# Patient Record
Sex: Male | Born: 1970 | Race: White | Hispanic: No | Marital: Married | Smoking: Never smoker
Health system: Southern US, Community
[De-identification: ages and names within clinical notes are randomized; demographics above are authoritative.]

## PROBLEM LIST (undated history)

## (undated) DIAGNOSIS — Z789 Other specified health status: Secondary | ICD-10-CM

## (undated) DIAGNOSIS — K6289 Other specified diseases of anus and rectum: Secondary | ICD-10-CM

## (undated) HISTORY — PX: LYMPH NODE BIOPSY: SHX201

## (undated) HISTORY — PX: COLONOSCOPY: SHX174

## (undated) HISTORY — PX: TYMPANOSTOMY TUBE PLACEMENT: SHX32

## (undated) HISTORY — PX: WISDOM TOOTH EXTRACTION: SHX21

## (undated) HISTORY — PX: TONSILLECTOMY: SUR1361

---

## 2000-11-04 ENCOUNTER — Encounter: Admission: RE | Admit: 2000-11-04 | Discharge: 2000-11-04 | Payer: Self-pay | Admitting: *Deleted

## 2011-05-21 ENCOUNTER — Other Ambulatory Visit: Payer: Self-pay | Admitting: Family Medicine

## 2011-05-21 ENCOUNTER — Ambulatory Visit
Admission: RE | Admit: 2011-05-21 | Discharge: 2011-05-21 | Disposition: A | Discharge status: Home / Self Care | Payer: Managed Care, Other (non HMO) | Source: Ambulatory Visit | Attending: Family Medicine | Admitting: Family Medicine

## 2011-05-21 DIAGNOSIS — R51 Headache: Secondary | ICD-10-CM

## 2011-05-23 ENCOUNTER — Other Ambulatory Visit: Payer: Self-pay

## 2011-05-29 ENCOUNTER — Other Ambulatory Visit: Payer: Self-pay | Admitting: Family Medicine

## 2011-05-29 DIAGNOSIS — R51 Headache: Secondary | ICD-10-CM

## 2011-06-01 ENCOUNTER — Ambulatory Visit
Admission: RE | Admit: 2011-06-01 | Discharge: 2011-06-01 | Disposition: A | Discharge status: Home / Self Care | Payer: Managed Care, Other (non HMO) | Source: Ambulatory Visit | Attending: Family Medicine | Admitting: Family Medicine

## 2011-06-01 DIAGNOSIS — R51 Headache: Secondary | ICD-10-CM

## 2011-06-01 MED ORDER — GADOBENATE DIMEGLUMINE 529 MG/ML IV SOLN
17.0000 mL | Freq: Once | INTRAVENOUS | Status: AC | PRN
  Administered 2011-06-01: 17 mL via INTRAVENOUS

## 2011-06-04 ENCOUNTER — Other Ambulatory Visit: Payer: Managed Care, Other (non HMO)

## 2012-03-23 ENCOUNTER — Other Ambulatory Visit: Payer: Self-pay | Admitting: Family Medicine

## 2012-03-23 DIAGNOSIS — R109 Unspecified abdominal pain: Secondary | ICD-10-CM

## 2012-03-24 ENCOUNTER — Ambulatory Visit
Admission: RE | Admit: 2012-03-24 | Discharge: 2012-03-24 | Disposition: A | Discharge status: Home / Self Care | Payer: Managed Care, Other (non HMO) | Source: Ambulatory Visit | Attending: Family Medicine | Admitting: Family Medicine

## 2012-03-24 DIAGNOSIS — R109 Unspecified abdominal pain: Secondary | ICD-10-CM

## 2012-04-20 ENCOUNTER — Other Ambulatory Visit: Payer: Self-pay | Admitting: Gastroenterology

## 2012-04-20 DIAGNOSIS — R109 Unspecified abdominal pain: Secondary | ICD-10-CM

## 2012-04-26 ENCOUNTER — Ambulatory Visit
Admission: RE | Admit: 2012-04-26 | Discharge: 2012-04-26 | Disposition: A | Discharge status: Home / Self Care | Payer: Self-pay | Source: Ambulatory Visit | Attending: Gastroenterology | Admitting: Gastroenterology

## 2012-04-26 ENCOUNTER — Other Ambulatory Visit: Payer: Self-pay | Admitting: Gastroenterology

## 2012-04-26 DIAGNOSIS — R109 Unspecified abdominal pain: Secondary | ICD-10-CM

## 2013-05-02 ENCOUNTER — Other Ambulatory Visit: Payer: Self-pay | Admitting: Otolaryngology

## 2013-05-02 ENCOUNTER — Ambulatory Visit
Admission: RE | Admit: 2013-05-02 | Discharge: 2013-05-02 | Disposition: A | Discharge status: Home / Self Care | Payer: Managed Care, Other (non HMO) | Source: Ambulatory Visit | Attending: Otolaryngology | Admitting: Otolaryngology

## 2013-05-02 DIAGNOSIS — R042 Hemoptysis: Secondary | ICD-10-CM

## 2013-05-03 ENCOUNTER — Other Ambulatory Visit: Payer: Self-pay | Admitting: Otolaryngology

## 2013-05-03 ENCOUNTER — Ambulatory Visit
Admission: RE | Admit: 2013-05-03 | Discharge: 2013-05-03 | Disposition: A | Discharge status: Home / Self Care | Payer: Managed Care, Other (non HMO) | Source: Ambulatory Visit | Attending: Otolaryngology | Admitting: Otolaryngology

## 2013-05-03 DIAGNOSIS — R042 Hemoptysis: Secondary | ICD-10-CM

## 2013-05-03 MED ORDER — IOHEXOL 300 MG/ML  SOLN
75.0000 mL | Freq: Once | INTRAMUSCULAR | Status: AC | PRN
  Administered 2013-05-03: 75 mL via INTRAVENOUS

## 2014-05-22 ENCOUNTER — Encounter (HOSPITAL_BASED_OUTPATIENT_CLINIC_OR_DEPARTMENT_OTHER): Payer: Self-pay | Admitting: *Deleted

## 2014-05-22 NOTE — Progress Notes (Signed)
No labs per anesthesia needed-called [facility] for orders

## 2014-05-23 ENCOUNTER — Other Ambulatory Visit: Payer: Self-pay | Admitting: General Surgery

## 2014-05-24 ENCOUNTER — Ambulatory Visit (HOSPITAL_BASED_OUTPATIENT_CLINIC_OR_DEPARTMENT_OTHER): Payer: Managed Care, Other (non HMO) | Admitting: Anesthesiology

## 2014-05-24 ENCOUNTER — Encounter (HOSPITAL_BASED_OUTPATIENT_CLINIC_OR_DEPARTMENT_OTHER)
Admission: RE | Disposition: A | Discharge status: Home / Self Care | Payer: Self-pay | Source: Ambulatory Visit | Attending: General Surgery

## 2014-05-24 ENCOUNTER — Ambulatory Visit (HOSPITAL_BASED_OUTPATIENT_CLINIC_OR_DEPARTMENT_OTHER)
Admission: RE | Admit: 2014-05-24 | Discharge: 2014-05-24 | Disposition: A | Discharge status: Home / Self Care | Payer: Managed Care, Other (non HMO) | Source: Ambulatory Visit | Attending: General Surgery | Admitting: General Surgery

## 2014-05-24 ENCOUNTER — Encounter (HOSPITAL_BASED_OUTPATIENT_CLINIC_OR_DEPARTMENT_OTHER): Payer: Self-pay

## 2014-05-24 DIAGNOSIS — K610 Anal abscess: Secondary | ICD-10-CM | POA: Diagnosis present

## 2014-05-24 HISTORY — DX: Other specified health status: Z78.9

## 2014-05-24 HISTORY — DX: Other specified diseases of anus and rectum: K62.89

## 2014-05-24 HISTORY — PX: MASS EXCISION: SHX2000

## 2014-05-24 HISTORY — PX: RECTAL EXAM UNDER ANESTHESIA: SHX6399

## 2014-05-24 LAB — POCT HEMOGLOBIN-HEMACUE: Hemoglobin: 15.6 g/dL (ref 13.0–17.0)

## 2014-05-24 SURGERY — EXAM UNDER ANESTHESIA, RECTUM
Anesthesia: General | Site: Rectum

## 2014-05-24 MED ORDER — CEFAZOLIN SODIUM-DEXTROSE 2-3 GM-% IV SOLR
2.0000 g | INTRAVENOUS | Status: AC
  Administered 2014-05-24: 2 g via INTRAVENOUS

## 2014-05-24 MED ORDER — CEFAZOLIN SODIUM-DEXTROSE 2-3 GM-% IV SOLR
INTRAVENOUS | Status: AC
  Filled 2014-05-24: qty 50

## 2014-05-24 MED ORDER — BUPIVACAINE-EPINEPHRINE (PF) 0.25% -1:200000 IJ SOLN
INTRAMUSCULAR | Status: AC
  Filled 2014-05-24: qty 30

## 2014-05-24 MED ORDER — DIBUCAINE 1 % RE OINT
TOPICAL_OINTMENT | RECTAL | Status: AC
  Filled 2014-05-24: qty 28

## 2014-05-24 MED ORDER — MIDAZOLAM HCL 2 MG/2ML IJ SOLN
INTRAMUSCULAR | Status: AC
  Filled 2014-05-24: qty 2

## 2014-05-24 MED ORDER — PROMETHAZINE HCL 25 MG/ML IJ SOLN: 6.2500 mg | INTRAMUSCULAR | Status: DC | PRN

## 2014-05-24 MED ORDER — LACTATED RINGERS IV SOLN
INTRAVENOUS | Status: DC
Start: 2014-05-24 — End: 2014-05-24
  Administered 2014-05-24 (×2): via INTRAVENOUS

## 2014-05-24 MED ORDER — BUPIVACAINE LIPOSOME 1.3 % IJ SUSP
INTRAMUSCULAR | Status: DC | PRN
  Administered 2014-05-24: 20 mL

## 2014-05-24 MED ORDER — OXYCODONE HCL 5 MG PO TABS: 5.0000 mg | ORAL_TABLET | Freq: Once | ORAL | Status: DC | PRN

## 2014-05-24 MED ORDER — HYDROMORPHONE HCL 1 MG/ML IJ SOLN: 0.2500 mg | INTRAMUSCULAR | Status: DC | PRN

## 2014-05-24 MED ORDER — FENTANYL CITRATE (PF) 100 MCG/2ML IJ SOLN
INTRAMUSCULAR | Status: AC
  Filled 2014-05-24: qty 6

## 2014-05-24 MED ORDER — BUPIVACAINE LIPOSOME 1.3 % IJ SUSP
INTRAMUSCULAR | Status: AC
  Filled 2014-05-24: qty 20

## 2014-05-24 MED ORDER — LIDOCAINE HCL (CARDIAC) 20 MG/ML IV SOLN
INTRAVENOUS | Status: DC | PRN
  Administered 2014-05-24: 100 mg via INTRAVENOUS

## 2014-05-24 MED ORDER — FENTANYL CITRATE (PF) 100 MCG/2ML IJ SOLN
INTRAMUSCULAR | Status: DC | PRN
  Administered 2014-05-24: 100 ug via INTRAVENOUS
  Administered 2014-05-24 (×2): 25 ug via INTRAVENOUS

## 2014-05-24 MED ORDER — PROPOFOL 10 MG/ML IV BOLUS
INTRAVENOUS | Status: DC | PRN
  Administered 2014-05-24: 200 mg via INTRAVENOUS

## 2014-05-24 MED ORDER — DIBUCAINE 1 % RE OINT
TOPICAL_OINTMENT | RECTAL | Status: DC | PRN
  Administered 2014-05-24: 1 via RECTAL

## 2014-05-24 MED ORDER — DEXAMETHASONE SODIUM PHOSPHATE 4 MG/ML IJ SOLN
INTRAMUSCULAR | Status: DC | PRN
  Administered 2014-05-24: 10 mg via INTRAVENOUS

## 2014-05-24 MED ORDER — MIDAZOLAM HCL 2 MG/2ML IJ SOLN
1.0000 mg | INTRAMUSCULAR | Status: DC | PRN
  Administered 2014-05-24: 2 mg via INTRAVENOUS

## 2014-05-24 MED ORDER — OXYCODONE-ACETAMINOPHEN 5-325 MG PO TABS: 1.0000 | ORAL_TABLET | ORAL | Status: AC | PRN

## 2014-05-24 MED ORDER — GLYCOPYRROLATE 0.2 MG/ML IJ SOLN
INTRAMUSCULAR | Status: DC | PRN
  Administered 2014-05-24: 0.2 mg via INTRAVENOUS

## 2014-05-24 MED ORDER — OXYCODONE HCL 5 MG/5ML PO SOLN: 5.0000 mg | Freq: Once | ORAL | Status: DC | PRN

## 2014-05-24 SURGICAL SUPPLY — 55 items
BLADE HEX COATED 2.75 (ELECTRODE) ×3 IMPLANT
BLADE SURG 10 STRL SS (BLADE) ×3 IMPLANT
BLADE SURG 15 STRL LF DISP TIS (BLADE) ×1 IMPLANT
BLADE SURG 15 STRL SS (BLADE) ×2
BRIEF STRETCH FOR OB PAD LRG (UNDERPADS AND DIAPERS) ×3 IMPLANT
CANISTER SUCT 1200ML W/VALVE (MISCELLANEOUS) IMPLANT
CHLORAPREP W/TINT 26ML (MISCELLANEOUS) IMPLANT
COVER BACK TABLE 60X90IN (DRAPES) IMPLANT
COVER MAYO STAND STRL (DRAPES) ×3 IMPLANT
DECANTER SPIKE VIAL GLASS SM (MISCELLANEOUS) IMPLANT
DRAPE LAPAROTOMY 100X72 PEDS (DRAPES) ×3 IMPLANT
DRAPE UTILITY XL STRL (DRAPES) ×3 IMPLANT
DRSG PAD ABDOMINAL 8X10 ST (GAUZE/BANDAGES/DRESSINGS) ×3 IMPLANT
ELECT REM PT RETURN 9FT ADLT (ELECTROSURGICAL) ×3
ELECTRODE REM PT RTRN 9FT ADLT (ELECTROSURGICAL) ×1 IMPLANT
GAUZE SPONGE 4X4 12PLY STRL (GAUZE/BANDAGES/DRESSINGS) ×3 IMPLANT
GLOVE BIO SURGEON STRL SZ 6 (GLOVE) ×3 IMPLANT
GLOVE BIO SURGEON STRL SZ 6.5 (GLOVE) ×2 IMPLANT
GLOVE BIO SURGEONS STRL SZ 6.5 (GLOVE) ×1
GLOVE BIOGEL PI IND STRL 6.5 (GLOVE) ×1 IMPLANT
GLOVE BIOGEL PI IND STRL 7.0 (GLOVE) ×1 IMPLANT
GLOVE BIOGEL PI INDICATOR 6.5 (GLOVE) ×2
GLOVE BIOGEL PI INDICATOR 7.0 (GLOVE) ×2
GLOVE ECLIPSE 6.5 STRL STRAW (GLOVE) ×3 IMPLANT
GOWN STRL REUS W/ TWL LRG LVL3 (GOWN DISPOSABLE) ×1 IMPLANT
GOWN STRL REUS W/TWL 2XL LVL3 (GOWN DISPOSABLE) ×3 IMPLANT
GOWN STRL REUS W/TWL LRG LVL3 (GOWN DISPOSABLE) ×2
LIQUID BAND (GAUZE/BANDAGES/DRESSINGS) ×3 IMPLANT
NEEDLE HYPO 25X1 1.5 SAFETY (NEEDLE) ×3 IMPLANT
NS IRRIG 1000ML POUR BTL (IV SOLUTION) IMPLANT
PACK BASIN DAY SURGERY FS (CUSTOM PROCEDURE TRAY) ×3 IMPLANT
PACK UNIVERSAL I (CUSTOM PROCEDURE TRAY) ×3 IMPLANT
PENCIL BUTTON HOLSTER BLD 10FT (ELECTRODE) ×3 IMPLANT
SLEEVE SCD COMPRESS KNEE MED (MISCELLANEOUS) ×3 IMPLANT
SPONGE GAUZE 4X4 12PLY STER LF (GAUZE/BANDAGES/DRESSINGS) ×3 IMPLANT
SPONGE HEMORRHOID 8X3CM (HEMOSTASIS) ×3 IMPLANT
SPONGE LAP 18X18 X RAY DECT (DISPOSABLE) ×3 IMPLANT
STAPLER VISISTAT 35W (STAPLE) IMPLANT
SUT CHROMIC 3 0 SH 27 (SUTURE) ×3 IMPLANT
SUT CHROMIC 4 0 PS 2 18 (SUTURE) ×6 IMPLANT
SUT CHROMIC 4 0 RB 1X27 (SUTURE) ×3 IMPLANT
SUT MNCRL AB 4-0 PS2 18 (SUTURE) ×3 IMPLANT
SUT SILK 3 0 TIES 17X18 (SUTURE)
SUT SILK 3-0 18XBRD TIE BLK (SUTURE) IMPLANT
SUT VIC AB 2-0 SH 27 (SUTURE)
SUT VIC AB 2-0 SH 27XBRD (SUTURE) IMPLANT
SUT VIC AB 3-0 SH 27 (SUTURE) ×2
SUT VIC AB 3-0 SH 27X BRD (SUTURE) ×1 IMPLANT
SUT VICRYL 4-0 PS2 18IN ABS (SUTURE) ×3 IMPLANT
SYR CONTROL 10ML LL (SYRINGE) ×3 IMPLANT
TOWEL OR 17X24 6PK STRL BLUE (TOWEL DISPOSABLE) ×3 IMPLANT
TOWEL OR NON WOVEN STRL DISP B (DISPOSABLE) ×3 IMPLANT
TUBE CONNECTING 20'X1/4 (TUBING)
TUBE CONNECTING 20X1/4 (TUBING) IMPLANT
YANKAUER SUCT BULB TIP NO VENT (SUCTIONS) IMPLANT

## 2014-05-24 NOTE — Anesthesia Procedure Notes (Signed)
Procedure Name: LMA Insertion Date/Time: 05/24/2014 2:39 PM Performed by: Gar GibbonKEETON, Jalene Demo S Pre-anesthesia Checklist: Patient identified, Emergency Drugs available, Suction available and Patient being monitored Patient Re-evaluated:Patient Re-evaluated prior to inductionOxygen Delivery Method: Circle System Utilized Preoxygenation: Pre-oxygenation with 100% oxygen Intubation Type: IV induction Ventilation: Mask ventilation without difficulty LMA: LMA inserted LMA Size: 5.0 Number of attempts: 1 Airway Equipment and Method: Bite block Placement Confirmation: positive ETCO2 Tube secured with: Tape Dental Injury: Teeth and Oropharynx as per pre-operative assessment

## 2014-05-24 NOTE — Transfer of Care (Signed)
Immediate Anesthesia Transfer of Care Note  Patient: Carl Bolton  Procedure(s) Performed: Procedure(s): RECTAL EXAM UNDER ANESTHESIA (N/A) EXCISION OF PERIANAL  MASS (N/A)  Patient Location: PACU  Anesthesia Type:General  Level of Consciousness: awake, sedated and patient cooperative  Airway & Oxygen Therapy: Patient Spontanous Breathing and Patient connected to face mask oxygen  Post-op Assessment: Report given to RN and Post -op Vital signs reviewed and stable  Post vital signs: Reviewed and stable  Last Vitals:  Filed Vitals:   05/24/14 1531  BP:   Pulse: 83  Temp:   Resp:     Complications: No apparent anesthesia complications

## 2014-05-24 NOTE — Op Note (Addendum)
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    SURGEON:  Surgeon(s): Almond LintFaera Symeon Puleo, MD  Assistant:   Engineer, structuralAriel Hilsinger, PA-S  ANESTHESIA:   local and general  DRAINS: none   LOCAL MEDICATIONS USED:  OTHER exparel  SPECIMEN:  Source of Specimen:  posterior perianal mass  DISPOSITION OF SPECIMEN:  PATHOLOGY  COUNTS:  YES  DICTATION: .Dragon Dictation  PLAN OF CARE: Discharge to home after PACU  PATIENT DISPOSITION:  PACU - hemodynamically stable.  FINDINGS:  8 mm firm chronic abscess cavity with fistula track external to the anus  EBL: min  PROCEDURE:   Patient was finally taken operating room where he was placed supine on the operating room table. General anesthesia was induced. The patient was then placed into low lithotomy position. A timeout was performed according to the surgical safety checklist. When all was correct, we continued.    The anus was prepped and draped in standard fashion. A the anus was examined and the small firm knot was identified just posterior to the anal verge in the midline. A small incision was made over the mass. The scissors were used to dissect around this. The area of firmness continued in a stalk to the superficial anal mucosa. The defect was completely excised and closed with a running 4-0 chromic. A 3-0 chromic was used on the deeper layers.    The area was dressed with gelfoam, dibucaine ointment, ABD pads, and mesh underwear.  Counts were correct.

## 2014-05-24 NOTE — Discharge Instructions (Signed)
[facility] _______[facility] ° °RECTAL SURGERY POST OP INSTRUCTIONS: POST OP INSTRUCTIONS ° °Always review your discharge instruction sheet given to you by the facility where your surgery was performed. °IF YOU HAVE DISABILITY OR FAMILY LEAVE FORMS, YOU MUST BRING THEM TO THE OFFICE FOR PROCESSING.   °DO NOT GIVE THEM TO YOUR DOCTOR. ° °1. A  prescription for pain medication may be given to you upon discharge.  Take your pain medication as prescribed, if needed.  If narcotic pain medicine is not needed, then you may take acetaminophen (Tylenol) or ibuprofen (Advil) as needed. °2. Take your usually prescribed medications unless otherwise directed. °3. If you need a refill on your pain medication, please contact your pharmacy.  They will contact our office to request authorization. Prescriptions will not be filled after 5 pm or on week-ends. °4. You should follow a light diet the first 48 hours after arrival home, such as soup and crackers, etc.  Be sure to include lots of fluids daily.  Resume your normal diet 2-3 days after surgery.. °5. Most patients will experience some swelling and discomfort in the rectal area. Ice packs, reclining and warm tub soaks will help.  Swelling and discomfort can take several days to resolve.  °6. It is common to experience some constipation if taking pain medication after surgery.  Increasing fluid intake and taking a stool softener (such as Colace) will usually help or prevent this problem from occurring.  A mild laxative (Milk of Magnesia or Miralax) should be taken according to package directions if there are no bowel movements after 48 hours. °7. Unless discharge instructions indicate otherwise, leave your bandage dry and in place for 24 hours, or remove the bandage if you have a bowel movement. You may notice a small amount of bleeding with bowel movements for the first few days. You may have some packing in the rectum which will come out over the first day or two. You  will need to wear an absorbent pad or soft cotton gauze in your underwear until the drainage stops.it. °8. ACTIVITIES:  You may resume regular (light) daily activities beginning the next day--such as daily self-care, walking, climbing stairs--gradually increasing activities as tolerated.  You may have sexual intercourse when it is comfortable.  Refrain from any heavy lifting or straining until approved by your doctor. °a. You may drive when you are no longer taking prescription pain medication, you can comfortably wear a seatbelt, and you can safely maneuver your car and apply brakes. °b. RETURN TO WORK: : ____________________ °c.  °9. You should see your doctor in the office for a follow-up appointment approximately 2-3 weeks after your surgery.  Make sure that you call for this appointment within a day or two after you arrive home to insure a convenient appointment time. °10. OTHER INSTRUCTIONS:  __________________________________________________________________________________________________________________________________________________________________________________________  °WHEN TO CALL YOUR DOCTOR: °1. Fever over 101.0 °2. Inability to urinate °3. Nausea and/or vomiting °4. Extreme swelling or bruising °5. Continued bleeding from rectum. °6. Increased pain, redness, or drainage from the incision °7. Constipation ° °The clinic staff is available to answer your questions during regular business hours.  Please don’t hesitate to call and ask to speak to one of the nurses for clinical concerns.  If you have a medical emergency, go to the nearest emergency room or call 911.  A surgeon from [facility] is always on call at the hospital ° ° °[address], [address], [place], [place]  [address] ? °   [id number], [place], [place]   [id number] [phone]) [phone] ? [phone] ? FAX [phone]) [phone] Web site: [email or web address]   Post Anesthesia Home Care Instructions  Activity: Get  plenty of rest for the remainder of the day. A responsible adult should stay with you for 24 hours following the procedure.  For the next 24 hours, DO NOT: -Drive a car -Advertising copywriterperate machinery -Drink alcoholic beverages -Take any medication unless instructed by your physician -Make any legal decisions or sign important papers.  Meals: Start with liquid foods such as gelatin or soup. Progress to regular foods as tolerated. Avoid greasy, spicy, heavy foods. If nausea and/or vomiting occur, drink only clear liquids until the nausea and/or vomiting subsides. Call your physician if vomiting continues.  Special Instructions/Symptoms: Your throat may feel dry or sore from the anesthesia or the breathing tube placed in your throat during surgery. If this causes discomfort, gargle with warm salt water. The discomfort should disappear within 24 hours.  If you had a scopolamine patch placed behind your ear for the management of post- operative nausea and/or vomiting:  1. The medication in the patch is effective for 72 hours, after which it should be removed.  Wrap patch in a tissue and discard in the trash. Wash hands thoroughly with soap and water. 2. You may remove the patch earlier than 72 hours if you experience unpleasant side effects which may include dry mouth, dizziness or visual disturbances. 3. Avoid touching the patch. Wash your hands with soap and water after contact with the patch.     Information for Discharge Teaching: EXPAREL (bupivacaine liposome injectable suspension)   Your surgeon gave you EXPAREL(bupivacaine) in your surgical incision to help control your pain after surgery.   EXPAREL is a local anesthetic that provides pain relief by numbing the tissue around the surgical site.  EXPAREL is designed to release pain medication over time and can control pain for up to 72 hours.  Depending on how you respond to EXPAREL, you may require less pain medication during your  recovery.  Possible side effects:  Temporary loss of sensation or ability to move in the area where bupivacaine was injected.  Nausea, vomiting, constipation  Rarely, numbness and tingling in your mouth or lips, lightheadedness, or anxiety may occur.  Call your doctor right away if you think you may be experiencing any of these sensations, or if you have other questions regarding possible side effects.  Follow all other discharge instructions given to you by your surgeon or nurse. Eat a healthy diet and drink plenty of water or other fluids.  If you return to the hospital for any reason within 96 hours following the administration of EXPAREL, please inform your health care providers.

## 2014-05-24 NOTE — H&P (Signed)
  Carl BouchardJason S. Haith 05/15/2014 10:42 AM Location: [facility] Patient #: [id number] DOB: 08/16/1970 Married / Language: [place] / Race: White Male  History of Present Illness  Patient words: rectal abscess.  The patient is a 44 year old male who presents with anal lesions. Previous history [Pt was seen around 2 weeks ago for perianal pain and was thought to have a thrombosed hemorrhoid. He ended up having a small posterior perirectal abscess which I performed an I&D on. Since then, the pain has resolved, but the knot he was feeling there has gotten slightly larger. He denies drainage.] He continues to have the small knot in the perianal skin. He will periodically notice it swell and drain. However, it continues to NOT go away, and repeatedly causes problems.    Allergies Fay Records(Ashley Beck, CMA; 05/15/2014 10:42 AM) No Known Drug Allergies12/02/2013  Medication History Fay Records(Ashley Beck, CMA; 05/15/2014 10:42 AM) Amoxicillin-Pot Clavulanate (875-125MG  Tablet, Oral) Active. No Current Medications (Taken starting 05/15/2014) GaviLyte-N with Flavor Pack (420GM For Solution, Oral) Active. ProAir RespiClick (108 (90 Base)MCG/ACT Aero Pow Br Act, Inhalation) Active. Proctosol HC (2.5% Cream, Rectal) Active. Qnasl (80MCG/ACT Aerosol Soln, Nasal) Active. Medications Reconciled  Review of Systems Almond Lint(Parks Czajkowski MD; 05/23/2014 4:51 PM) All other systems negative   Vitals Fay Records(Ashley Beck CMA; 05/15/2014 10:43 AM) 05/15/2014 10:43 AM Weight: 195 lb Height: 73in Body Surface Area: 2.13 m Body Mass Index: 25.73 kg/m Temp.: 98.59F(Oral)  Pulse: 72 (Regular)  Resp.: 18 (Unlabored)  BP: 128/78 (Sitting, Left Arm, Standard)    Physical Exam Almond Lint(Ahman Dugdale MD; 05/23/2014 4:52 PM) General Mental Status-Alert. General Appearance-Consistent with stated age. Hydration-Well hydrated. Voice-Normal.  Head and Neck Head-normocephalic, atraumatic with no lesions or palpable  masses. Trachea-midline. Thyroid Gland Characteristics - normal size and consistency.  Cardiovascular Cardiovascular examination reveals -normal pedal pulses bilaterally. Note: regular rate and rhythm  Rectal Note: posteriorly, there is still a 8-10 mm knot in just distal to the dentate line. There is still a small sinus tract and tenderness in this region.     Assessment & Plan Almond Lint(Othelia Riederer MD; 05/23/2014 4:55 PM) PERIANAL MASS (787.99  R19.8) Impression: Given the prolonged nature of this mass, I will take him to the OR and excise it under general anaesthesia. If it is a chronic abscess, we may just have to excise it. Current Plans  Schedule for Surgery Instructions:  Main risks are bleeding, infection, pain, recurrent problem if infectious, difficulty healing.   Signed by Almond LintFaera Bertram Haddix, MD (05/23/2014 4:57 PM)

## 2014-05-24 NOTE — Anesthesia Postprocedure Evaluation (Signed)
Anesthesia Post Note  Patient: Carl BouchardJason S Seabrooks  Procedure(s) Performed: Procedure(s) (LRB): RECTAL EXAM UNDER ANESTHESIA (N/A) EXCISION OF PERIANAL  MASS (N/A)  Anesthesia type: general  Patient location: PACU  Post pain: Pain level controlled  Post assessment: Patient's Cardiovascular Status Stable  Last Vitals:  Filed Vitals:   05/24/14 1600  BP: 126/62  Pulse: 51  Temp: 36.4 C  Resp: 18    Post vital signs: Reviewed and stable  Level of consciousness: sedated  Complications: No apparent anesthesia complications

## 2014-05-24 NOTE — Anesthesia Preprocedure Evaluation (Signed)
Anesthesia Evaluation  Patient identified by MRN, date of birth, ID band Patient awake    Reviewed: Allergy & Precautions, H&P , NPO status , Patient's Chart, lab work & pertinent test results  Airway Mallampati: II TM Distance: >3 FB Neck ROM: full    Dental  (+) Teeth Intact, Dental Advidsory Given   Pulmonary neg pulmonary ROS,  breath sounds clear to auscultation        Cardiovascular negative cardio ROS  Rhythm:regular Rate:Normal     Neuro/Psych negative neurological ROS  negative psych ROS   GI/Hepatic negative GI ROS, Neg liver ROS,   Endo/Other  negative endocrine ROS  Renal/GU negative Renal ROS     Musculoskeletal   Abdominal   Peds  Hematology   Anesthesia Other Findings   Reproductive/Obstetrics negative OB ROS                           Anesthesia Physical Anesthesia Plan  ASA: I  Anesthesia Plan: General LMA   Post-op Pain Management:    Induction:   Airway Management Planned:   Additional Equipment:   Intra-op Plan:   Post-operative Plan:   Informed Consent: I have reviewed the patients History and Physical, chart, labs and discussed the procedure including the risks, benefits and alternatives for the proposed anesthesia with the patient or authorized representative who has indicated his/her understanding and acceptance.   Dental Advisory Given  Plan Discussed with: Anesthesiologist, CRNA and Surgeon  Anesthesia Plan Comments:         Anesthesia Quick Evaluation  

## 2014-05-25 ENCOUNTER — Encounter (HOSPITAL_BASED_OUTPATIENT_CLINIC_OR_DEPARTMENT_OTHER): Payer: Self-pay | Admitting: General Surgery

## 2014-05-28 NOTE — Progress Notes (Signed)
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 ______

## 2014-09-12 ENCOUNTER — Other Ambulatory Visit: Payer: Self-pay | Admitting: *Deleted

## 2014-09-12 DIAGNOSIS — G5603 Carpal tunnel syndrome, bilateral upper limbs: Secondary | ICD-10-CM

## 2014-09-17 ENCOUNTER — Telehealth: Payer: Self-pay | Admitting: Neurology

## 2014-09-17 NOTE — Telephone Encounter (Signed)
I imagine he does not, but unsure.  Morrie Sheldon, please advise.

## 2014-09-17 NOTE — Telephone Encounter (Signed)
Pt has a question/ Should he be getting a consult before the 10/02/14 EMG appt first? Call back @ [phone]

## 2014-09-19 NOTE — Telephone Encounter (Signed)
Patient given appt 

## 2014-09-19 NOTE — Telephone Encounter (Signed)
He is having numbness in his ear.  It started in his hand but has eased up there.  He says that it is driving him crazy.  He will be gone all next week and really wants to be seen.  If we have a cancellation, he can be here within 30 minutes.

## 2014-09-19 NOTE — Telephone Encounter (Signed)
His referring provider can decide whether he needs a consult or not.  We can place him on a wait list, if he does.  Kelon Easom K. Allena Katz, DO

## 2014-09-25 ENCOUNTER — Encounter: Payer: Managed Care, Other (non HMO) | Admitting: Neurology

## 2014-10-02 ENCOUNTER — Ambulatory Visit (INDEPENDENT_AMBULATORY_CARE_PROVIDER_SITE_OTHER): Payer: Managed Care, Other (non HMO) | Admitting: Neurology

## 2014-10-02 DIAGNOSIS — G5602 Carpal tunnel syndrome, left upper limb: Secondary | ICD-10-CM

## 2014-10-02 DIAGNOSIS — G5601 Carpal tunnel syndrome, right upper limb: Secondary | ICD-10-CM | POA: Diagnosis not present

## 2014-10-02 DIAGNOSIS — G5603 Carpal tunnel syndrome, bilateral upper limbs: Secondary | ICD-10-CM

## 2014-10-02 NOTE — Procedures (Signed)
[facility]  [address] [place], [address]  [place], [place] [address] Tel: [phone] Fax:  [phone] Test Date:  10/02/2014  Patient: Carl Bolton DOB: 01/24/1971 Physician: Nita Sickle, DO  Sex: Male Height: 6\' 1"  Ref Phys: Elwyn Lade, M.D.  ID#: [id number] Temp: 32.3C Technician: Judie Petit. Dean   Patient Complaints: This is a 44 year old gentleman presenting for evaluation of bilateral hand numbness and tingling.   NCV & EMG Findings: Extensive electrodiagnostic testing of the right upper extremity and additional studies of the left shows:  1. Bilateral median, ulnar, radial, and palmar sensory responses are within normal limits. 2. Bilateral median and ulnar motor responses are within normal limits. 3. There is no evidence of active or chronic motor axon loss changes affecting any of the tested muscles. Motor unit recruitment and configuration is within normal limits.  Impression: This is a normal study. In particular, there is no evidence of a cervical radiculopathy or carpal tunnel syndrome affecting the upper extremities.   ___________________________ Nita Sickle, DO    Nerve Conduction Studies Anti Sensory Summary Table   Site NR Peak (ms) Norm Peak (ms) P-T Amp (V) Norm P-T Amp  Left Median Anti Sensory (2nd Digit)  32.3C  Wrist    3.3 <3.4 55.2 >20  Right Median Anti Sensory (2nd Digit)  32.3C  Wrist    3.0 <3.4 42.5 >20  Left Radial Anti Sensory (Base 1st Digit)  32.3C  Wrist    2.4 <2.7 29.2 >18  Right Radial Anti Sensory (Base 1st Digit)  32.3C  Wrist    2.1 <2.7 31.5 >18  Left Ulnar Anti Sensory (5th Digit)  32.3C  Wrist    2.9 <3.1 29.4 >12  Right Ulnar Anti Sensory (5th Digit)  32.3C  Wrist    3.1 <3.1 26.0 >12   Motor Summary Table   Site NR Onset (ms) Norm Onset (ms) O-P Amp (mV) Norm O-P Amp Site1 Site2 Delta-0 (ms) Dist (cm) Vel (m/s) Norm Vel (m/s)  Left Median Motor (Abd Poll Brev)  32.3C  Wrist    3.3 <3.9 10.1 >6 Elbow Wrist  4.7 25.0 53 >50  Elbow    8.0  9.9         Right Median Motor (Abd Poll Brev)  32.3C  Wrist    2.8 <3.9 8.7 >6 Elbow Wrist 4.7 27.0 57 >50  Elbow    7.5  8.2         Left Ulnar Motor (Abd Dig Minimi)  32.3C  Wrist    2.8 <3.1 8.8 >7 B Elbow Wrist 4.3 25.0 58 >50  B Elbow    7.1  8.6  A Elbow B Elbow 1.7 10.0 59 >50  A Elbow    8.8  8.0         Right Ulnar Motor (Abd Dig Minimi)  32.3C  Wrist    2.6 <3.1 8.1 >7 B Elbow Wrist 4.1 22.0 54 >50  B Elbow    6.7  7.9  A Elbow B Elbow 1.6 10.0 62 >50  A Elbow    8.3  7.6          Comparison Summary Table   Site NR Peak (ms) Norm Peak (ms) P-T Amp (V) Site1 Site2 Delta-P (ms) Norm Delta (ms)  Left Median/Ulnar Palm Comparison (Wrist - 8cm)  32.3C  Median Palm    1.7 <2.2 127.2 Median Palm Ulnar Palm 0.1   Ulnar Palm    1.8 <2.2 70.1  Right Median/Ulnar Palm Comparison (Wrist - 8cm)  32.3C  Median Palm    1.7 <2.2 36.6 Median Palm Ulnar Palm 0.1   Ulnar Palm    1.8 <2.2 30.7       F Wave Studies   NR F-Lat (ms) Lat Norm (ms) L-R F-Lat (ms)  Left Ulnar (Mrkrs) (Abd Dig Min)  32.3C     29.49 <33 0.76  Right Ulnar (Mrkrs) (Abd Dig Min)  32.3C     28.73 <33 0.76   EMG   Side Muscle Ins Act Fibs Psw Fasc Number Recrt Dur Dur. Amp Amp. Poly Poly. Comment  Left 1stDorInt Nml Nml Nml Nml Nml Nml Nml Nml Nml Nml Nml Nml N/A  Left Ext Indicis Nml Nml Nml Nml Nml Nml Nml Nml Nml Nml Nml Nml N/A  Left PronatorTeres Nml Nml Nml Nml Nml Nml Nml Nml Nml Nml Nml Nml N/A  Left Biceps Nml Nml Nml Nml Nml Nml Nml Nml Nml Nml Nml Nml N/A  Left Triceps Nml Nml Nml Nml Nml Nml Nml Nml Nml Nml Nml Nml N/A  Left Deltoid Nml Nml Nml Nml Nml Nml Nml Nml Nml Nml Nml Nml N/A  Right 1stDorInt Nml Nml Nml Nml Nml Nml Nml Nml Nml Nml Nml Nml N/A  Right Ext Indicis Nml Nml Nml Nml Nml Nml Nml Nml Nml Nml Nml Nml N/A  Right PronatorTeres Nml Nml Nml Nml Nml Nml Nml Nml Nml Nml Nml Nml N/A  Right Biceps Nml Nml Nml Nml Nml Nml Nml Nml Nml Nml Nml Nml N/A   Right Triceps Nml Nml Nml Nml Nml Nml Nml Nml Nml Nml Nml Nml N/A  Right Deltoid Nml Nml Nml Nml Nml Nml Nml Nml Nml Nml Nml Nml N/A    Waveforms:

## 2014-10-10 ENCOUNTER — Encounter: Payer: Self-pay | Admitting: *Deleted

## 2014-10-11 ENCOUNTER — Ambulatory Visit (INDEPENDENT_AMBULATORY_CARE_PROVIDER_SITE_OTHER): Payer: Managed Care, Other (non HMO) | Admitting: Neurology

## 2014-10-11 ENCOUNTER — Encounter: Payer: Self-pay | Admitting: Neurology

## 2014-10-11 VITALS — BP 126/82 | HR 72 | Temp 98.1°F | Resp 16 | Ht 73.0 in | Wt 187.9 lb

## 2014-10-11 DIAGNOSIS — R5383 Other fatigue: Secondary | ICD-10-CM

## 2014-10-11 DIAGNOSIS — G44221 Chronic tension-type headache, intractable: Secondary | ICD-10-CM

## 2014-10-11 DIAGNOSIS — R202 Paresthesia of skin: Secondary | ICD-10-CM | POA: Insufficient documentation

## 2014-10-11 DIAGNOSIS — R5381 Other malaise: Secondary | ICD-10-CM | POA: Diagnosis not present

## 2014-10-11 DIAGNOSIS — R252 Cramp and spasm: Secondary | ICD-10-CM

## 2014-10-11 DIAGNOSIS — R209 Unspecified disturbances of skin sensation: Secondary | ICD-10-CM | POA: Diagnosis not present

## 2014-10-11 LAB — C-REACTIVE PROTEIN: CRP: <0.5 mg/dL (ref <0.60)

## 2014-10-11 LAB — VITAMIN B12: VITAMIN B 12: 1812 pg/mL — AB (ref 211–911)

## 2014-10-11 NOTE — Progress Notes (Signed)
Note routed

## 2014-10-11 NOTE — Progress Notes (Signed)
[facility] Clinic Note - Initial Visit   Date: 10/11/2014  Carl Bolton MRN: [id number] DOB: 03/20/1970   Dear Dr. Moreen Fowler:  Thank you for your kind referral of Carl Bolton for consultation of generalized paresthesias. Although his history is well known to you, please allow [place] to reiterate it for the purpose of our medical record. The patient was accompanied to the clinic by wife who also provides collateral information.     History of Present Illness: Carl Bolton is a 44 y.o. right-handed Caucasian male with asthma and anxiety presenting for evaluation of a constellation of neurological symptoms.    In early July, he woke up with right sided jaw pain worsening with clenching over the TMJ region and saw a dentist whose x-rays were normal.  He then saw endodontist whose evaluation was normal. They suspected there was possible muscle strain.  A week after symptom onset, he developed tingling sensation of the outer ear canal.  He was evaluated by Dr. Redmond Baseman at [facility] for right ear numbness. He was treated with antibiotic ear drops.  Since then, he has developed tingling sensation of the fingers and hands which is sporadic.  He tried ibuprofen and wrist splints which has helped.  It is worsened with strenuous activity.  He underwent EMG of the upper extremities performed by myself which was normal and did not show evidence of CTS.   On August 12th, he woke up feeling as if his hands and head was pulsing.  He was taking lorazepam daily for anxiety which was started on August 3rd.  On August 26th, he started experiencing muscle twitches of the calf muscles bilaterally.   He has chronic fatigue and dizziness in the morning.  He has constant pulsing sensation of the entire body which is worse at rest.  He feels sensation of motion even when he has stopped and at rest.    He occasionally has right sided throbbing headache since 2013.  Headaches are episodic and  occur about 3-5 times per month.  No associated photophobia, phonophobia, n/v.    His wife states that he is hypervigilant on all health-related issues and has greater anxiety related to his symptoms, moreso than generalized anxiety.  He spends a lot of time online researching his symptoms which makes him even more concerned and worried.  Out-side paper records, electronic medical record, and images have been reviewed where available and summarized as:  EMG of the upper extremities 10/02/2014:  This is a normal study. In particular, there is no evidence of a cervical radiculopathy or carpal tunnel syndrome affecting the upper extremities.  Labs 09/07/2014:  Na 141, K 5.0, glucose 127, Cr 0.85, Ca 10.0, AST 18, ALT 17, TSH 0.79  MRI brain 06/01/2011:   1. No acute intracranial abnormality. 2.  Mild for age nonspecific subcortical white matter signal changes.  Differential considerations include accelerated small vessel ischemia, sequelae of trauma, hypercoagulable state, vasculitis, migraines, prior infection or demyelination.  MRA head 05/21/2011: Negative MR angiography of the intracranial circulation.  No stenosis, dissection, or aneurysm is identified.  Past Medical History  Diagnosis Date  . Medical history non-contributory   . Rectal mass     drains occ    Past Surgical History  Procedure Laterality Date  . Tonsillectomy    . Wisdom tooth extraction    . Colonoscopy    . Tympanostomy tube placement      both ears x2  . Lymph node biopsy  neck as teen  . Rectal exam under anesthesia N/A 05/24/2014    Procedure: RECTAL EXAM UNDER ANESTHESIA;  Surgeon: Stark Klein, MD;  Location: [place];  Service: General;  Laterality: N/A;  . Mass excision N/A 05/24/2014    Procedure: EXCISION OF PERIANAL  MASS;  Surgeon: Stark Klein, MD;  Location: [place];  Service: General;  Laterality: N/A;     Medications:  Outpatient Encounter Prescriptions as of  10/11/2014  Medication Sig Note  . magnesium oxide (MAG-OX) 400 MG tablet Take 400 mg by mouth daily.   . Multiple Vitamins-Minerals (MULTIVITAMIN WITH MINERALS) tablet Take 1 tablet by mouth daily.   Carl Bolton 80 MCG/ACT AERS  10/10/2014: Received from: External Pharmacy  . vitamin B-12 (CYANOCOBALAMIN) 100 MCG tablet Take 100 mcg by mouth daily.   . Albuterol Sulfate (PROAIR RESPICLICK) 026 (90 BASE) MCG/ACT AEPB INHALE 2 PUFFS EVERY 4-6 HOURS AS NEEDED. 10/10/2014: Received from: [facility]  . EPINEPHrine (EPIPEN 2-PAK) 0.3 mg/0.3 mL IJ SOAJ injection Inject into the muscle once.   Marland Kitchen LORazepam (ATIVAN) 0.5 MG tablet Take 0.5 mg by mouth every 8 (eight) hours.   Marland Kitchen oxyCODONE-acetaminophen (ROXICET) 5-325 MG per tablet Take 1-2 tablets by mouth every 4 (four) hours as needed for severe pain. (Patient not taking: Reported on 10/11/2014)    No facility-administered encounter medications on file as of 10/11/2014.     Allergies: No Known Allergies  Family History: Family History  Problem Relation Age of Onset  . Thyroid disease Father   . Hypothyroidism Sister     Social History: Social History  Substance Use Topics  . Smoking status: Never Smoker   . Smokeless tobacco: Never Used  . Alcohol Use: 0.0 oz/week    0 Standard drinks or equivalent per week     Comment: occasional   Social History   Social History Narrative   Patient lives at home with wife and two kids and three story house   Patient is a Biochemist, clinical.    Review of Systems:  CONSTITUTIONAL: No fevers, chills, night sweats, or weight loss.   EYES: No visual changes or eye pain ENT: No hearing changes.  No history of nose bleeds.   RESPIRATORY: No cough, wheezing and shortness of breath.   CARDIOVASCULAR: Negative for chest pain, and palpitations.   GI: Negative for abdominal discomfort, blood in stools or black stools.  No recent change in bowel habits.   GU:  No history of incontinence.   MUSCLOSKELETAL: No  history of joint pain or swelling.  No myalgias.   SKIN: Negative for lesions, rash, and itching.   HEMATOLOGY/ONCOLOGY: Negative for prolonged bleeding, bruising easily, and swollen nodes.  No history of cancer.   ENDOCRINE: Negative for cold or heat intolerance, polydipsia or goiter.   PSYCH:  +depression ++anxiety symptoms.   NEURO: As Above.   Vital Signs:  BP 126/82 mmHg  Pulse 72  Temp(Src) 98.1 F (36.7 C) (Oral)  Resp 16  Ht '6\' 1"'  (1.854 m)  Wt 187 lb 14.4 oz (85.231 kg)  BMI 24.80 kg/m2  SpO2 98% Pain Scale: 0 on a scale of 0-10   General Medical Exam:   General:  Well appearing, comfortable.   Eyes/ENT: see cranial nerve examination.   Neck: No masses appreciated.  Full range of motion without tenderness.  No carotid bruits. Respiratory:  Clear to auscultation, good air entry bilaterally.   Cardiac:  Regular rate and rhythm, no murmur.  Extremities:  No deformities, edema, or skin discoloration.  Skin:  No rashes or lesions.  Neurological Exam: MENTAL STATUS including orientation to time, place, person, recent and remote memory, attention span and concentration, language, and fund of knowledge is normal.  Speech is not dysarthric.  CRANIAL NERVES: II:  No visual field defects.  Unremarkable fundi.   III-IV-VI: Pupils equal round and reactive to light.  Normal conjugate, extra-ocular eye movements in all directions of gaze.  No nystagmus.  No ptosis.  V:  Normal facial sensation.   VII:  Normal facial symmetry and movements.  No pathologic facial reflexes.  VIII:  Normal hearing and vestibular function.   IX-X:  Normal palatal movement.   XI:  Normal shoulder shrug and head rotation.   XII:  Normal tongue strength and range of motion, no deviation or fasciculation.  MOTOR:  No atrophy, fasciculations or abnormal movements.  No pronator drift.  Tone is normal.    Right Upper Extremity:    Left Upper Extremity:    Deltoid  5/5   Deltoid  5/5   Biceps  5/5    Biceps  5/5   Triceps  5/5   Triceps  5/5   Wrist extensors  5/5   Wrist extensors  5/5   Wrist flexors  5/5   Wrist flexors  5/5   Finger extensors  5/5   Finger extensors  5/5   Finger flexors  5/5   Finger flexors  5/5   Dorsal interossei  5/5   Dorsal interossei  5/5   Abductor pollicis  5/5   Abductor pollicis  5/5   Tone (Ashworth scale)  0  Tone (Ashworth scale)  0   Right Lower Extremity:    Left Lower Extremity:    Hip flexors  5/5   Hip flexors  5/5   Hip extensors  5/5   Hip extensors  5/5   Knee flexors  5/5   Knee flexors  5/5   Knee extensors  5/5   Knee extensors  5/5   Dorsiflexors  5/5   Dorsiflexors  5/5   Plantarflexors  5/5   Plantarflexors  5/5   Toe extensors  5/5   Toe extensors  5/5   Toe flexors  5/5   Toe flexors  5/5   Tone (Ashworth scale)  0  Tone (Ashworth scale)  0   MSRs:  Right                                                                 Left brachioradialis 2+  brachioradialis 2+  biceps 2+  biceps 2+  triceps 2+  triceps 2+  patellar 2+  patellar 2+  ankle jerk 2+  ankle jerk 2+  Hoffman no  Hoffman no  plantar response down  plantar response down   SENSORY:  Normal and symmetric perception of light touch, pinprick, vibration, and proprioception.  Romberg's sign absent.   COORDINATION/GAIT: Normal finger-to- nose-finger and heel-to-shin.  Intact rapid alternating movements bilaterally.  Able to rise from a chair without using arms.  Gait narrow based and stable. Tandem and stressed gait intact.    IMPRESSION: Mr. Vertz is a 44 year-old gentleman presenting for evaluation of constellation of neurological symptoms including sporadic paresthesias of  the ear and hands, abnormal feeling of a constant motion, whole-body pulsations, and muscle cramps.  His exam is entirely normal and non-focal, making symptoms difficult to localize.  His NCS/EMG of the upper extremities was normal.  For completeness, I will order MRI brain to exclude demyelinating  disease.  Previous MRI brain from 2013 was reviewed and essentially within normal limits.  Additional labs screening for inflammatory disease and vitamin deficiency will also be checked.  I reassured the patient that with his normal exam and previous normal imaging, my suspicion for a worrisome neurodegenerative condition is low and it is possible symptoms are a manifestation of stress reaction/anxiety.    PLAN/RECOMMENDATIONS:  1.  Check ESR, CRP, vitamin B12, vitamin D 2.  MRI brain wwo contrast  Telephone update with results.    The duration of this appointment visit was 40 minutes of face-to-face time with the patient.  Greater than 50% of this time was spent in counseling, explanation of diagnosis, planning of further management, and coordination of care.   Thank you for allowing me to participate in patient's care.  If I can answer any additional questions, I would be pleased to do so.    Sincerely,    Travarius Lange K. Posey Pronto, DO

## 2014-10-11 NOTE — Patient Instructions (Addendum)
1. Check blood work 2. MRI brain wwo contrast 3. We will call you with the results of the testing

## 2014-10-12 LAB — VITAMIN D 25 HYDROXY (VIT D DEFICIENCY, FRACTURES): Vit D, 25-Hydroxy: 25 ng/mL — ABNORMAL LOW (ref 30–100)

## 2014-10-12 LAB — SEDIMENTATION RATE: SED RATE: 4 mm/h (ref 0–15)

## 2014-10-15 ENCOUNTER — Ambulatory Visit
Admission: RE | Admit: 2014-10-15 | Discharge: 2014-10-15 | Disposition: A | Discharge status: Home / Self Care | Payer: Managed Care, Other (non HMO) | Source: Ambulatory Visit | Attending: Neurology | Admitting: Neurology

## 2014-10-15 ENCOUNTER — Telehealth: Payer: Self-pay | Admitting: Neurology

## 2014-10-15 DIAGNOSIS — R202 Paresthesia of skin: Secondary | ICD-10-CM

## 2014-10-15 DIAGNOSIS — R252 Cramp and spasm: Secondary | ICD-10-CM

## 2014-10-15 DIAGNOSIS — R5381 Other malaise: Secondary | ICD-10-CM

## 2014-10-15 DIAGNOSIS — G44221 Chronic tension-type headache, intractable: Secondary | ICD-10-CM

## 2014-10-15 DIAGNOSIS — R209 Unspecified disturbances of skin sensation: Secondary | ICD-10-CM

## 2014-10-15 DIAGNOSIS — R5383 Other fatigue: Secondary | ICD-10-CM

## 2014-10-15 MED ORDER — GADOBENATE DIMEGLUMINE 529 MG/ML IV SOLN
17.0000 mL | Freq: Once | INTRAVENOUS | Status: AC | PRN
  Administered 2014-10-15: 17 mL via INTRAVENOUS

## 2014-10-15 NOTE — Telephone Encounter (Signed)
Pt called and stated he is having an MRI today and wanted Dr Allena Katz to know he is having a lot of muscle and leg twitching and didn't know if she needed to know that when reading the MRI/Dawn CB# [phone]

## 2014-10-15 NOTE — Telephone Encounter (Signed)
FYI

## 2014-10-19 ENCOUNTER — Telehealth: Payer: Self-pay | Admitting: Neurology

## 2014-10-19 NOTE — Telephone Encounter (Signed)
Please advise 

## 2014-10-19 NOTE — Telephone Encounter (Signed)
Stephanie/ from [facility] called/stating that pt wanted his MRI Result/ Report from 10/15/14// pt phone# [phone]

## 2014-10-19 NOTE — Telephone Encounter (Signed)
Pt wants the results from the MRI please call [phone]

## 2014-10-19 NOTE — Telephone Encounter (Signed)
See results note. 

## 2014-10-19 NOTE — Telephone Encounter (Signed)
See next note

## 2014-10-21 ENCOUNTER — Other Ambulatory Visit: Payer: Managed Care, Other (non HMO)

## 2014-11-14 ENCOUNTER — Other Ambulatory Visit: Payer: Self-pay | Admitting: *Deleted

## 2014-11-14 DIAGNOSIS — R253 Fasciculation: Secondary | ICD-10-CM

## 2014-11-20 ENCOUNTER — Ambulatory Visit (INDEPENDENT_AMBULATORY_CARE_PROVIDER_SITE_OTHER): Payer: Managed Care, Other (non HMO) | Admitting: Neurology

## 2014-11-20 DIAGNOSIS — R253 Fasciculation: Secondary | ICD-10-CM | POA: Diagnosis not present

## 2014-11-20 NOTE — Procedures (Signed)
[facility]  [address], [address]  [place], [place] [address] Tel: [phone] Fax:  [phone] Test Date:  11/20/2014  Patient: Carl QuailsJason Husak DOB: 06/08/1970 Physician: Nita Sickleonika Patel, DO  Sex: Male Height: 6\' 1"  Ref Phys: Dr Tiburcio PeaHarris  ID#: [id number] Temp: 32.3C Technician: Judie PetitM. Dean   Patient Complaints: This is a 44 year old gentleman presenting for evaluation of muscle twitches and cramps of the lower extremities.   NCV & EMG Findings: Extensive electrodiagnostic testing of the left lower extremity and additional studies of the right shows:  1. Bilateral sural and superficial peroneal sensory responses are within normal limits. 2. Bilateral tibial and peroneal motor responses are within normal limits. 3. Bilateral H reflex studies are within normal limits. 4. There is no evidence of active or chronic motor axon loss changes affecting any of the tested muscles. Motor unit configuration and recruitment pattern is within normal limits. In particular, there are no fasciculation potentials seen in any of the tested muscles.   Impression: This is a normal study of the lower extremities.   In particular, there is no evidence of a generalized sensorimotor polyneuropathy, diffuse myopathy, lumbosacral radiculopathy, or disorder of anterior horn cells.   ___________________________ Nita Sickleonika Patel, DO    Nerve Conduction Studies Anti Sensory Summary Table   Site NR Peak (ms) Norm Peak (ms) P-T Amp (V) Norm P-T Amp  Left Sup Peroneal Anti Sensory (Ant Lat Mall)  32.3C  12 cm    3.2 <4.5 6.2 >5  Right Sup Peroneal Anti Sensory (Ant Lat Mall)  12 cm    3.2 <4.5 6.8 >5  Left Sural Anti Sensory (Lat Mall)  32.3C  Calf    3.8 <4.5 8.8 >5  Right Sural Anti Sensory (Lat Mall)  Calf    4.1 <4.5 8.5 >5   Motor Summary Table   Site NR Onset (ms) Norm Onset (ms) O-P Amp (mV) Norm O-P Amp Site1 Site2 Delta-0 (ms) Dist (cm) Vel (m/s) Norm Vel (m/s)  Left Peroneal Motor (Ext  Dig Brev)  32.3C  Ankle    4.3 <5.5 5.0 >3 B Fib Ankle 8.2 35.0 43 >40  B Fib    12.5  4.9  Poplt B Fib 2.0 10.0 50 >40  Poplt    14.5  4.8         Right Peroneal Motor (Ext Dig Brev)  Ankle    3.6 <5.5 6.4 >3 B Fib Ankle 7.7 34.0 44 >40  B Fib    11.3  5.8  Poplt B Fib 1.7 10.0 59 >40  Poplt    13.0  5.8         Left Tibial Motor (Abd Hall Brev)  32.3C  Ankle    3.5 <6.0 10.2 >8 Knee Ankle 10.5 46.0 44 >40  Knee    14.0  7.9         Right Tibial Motor (Abd Hall Brev)  32.3C  Ankle    2.8 <6.0 12.5 >8 Knee Ankle 9.9 44.0 44 >40  Knee    12.7  6.8          H Reflex Studies   NR H-Lat (ms) Lat Norm (ms) L-R H-Lat (ms)  Left Tibial (Gastroc)  32.3C     32.52 <35 1.77  Right Tibial (Gastroc)  32.3C     30.75 <35 1.77   EMG   Side Muscle Ins Act Fibs Psw Fasc Number Recrt Dur Dur. Amp Amp. Poly Poly. Comment  Right Gastroc Nml Nml  Nml Nml Nml Nml Nml Nml Nml Nml Nml Nml N/A  Right RectFemoris Nml Nml Nml Nml Nml Nml Nml Nml Nml Nml Nml Nml N/A  Right AntTibialis Nml Nml Nml Nml Nml Nml Nml Nml Nml Nml Nml Nml N/A  Right Lumbo Parasp Low Nml Nml Nml Nml Nml Nml Nml Nml Nml Nml Nml Nml N/A  Right GluteusMed Nml Nml Nml Nml Nml Nml Nml Nml Nml Nml Nml Nml N/A  Left Flex Dig Long Nml Nml Nml Nml Nml Nml Nml Nml Nml Nml Nml Nml N/A  Left Gastroc Nml Nml Nml Nml Nml Nml Nml Nml Nml Nml Nml Nml N/A  Left BicepsFemS Nml Nml Nml Nml Nml Nml Nml Nml Nml Nml Nml Nml N/A  Left RectFemoris Nml Nml Nml Nml Nml Nml Nml Nml Nml Nml Nml Nml N/A  Left GluteusMed Nml Nml Nml Nml Nml Nml Nml Nml Nml Nml Nml Nml N/A  Left AntTibialis Nml Nml Nml Nml Nml Nml Nml Nml Nml Nml Nml Nml N/A      Waveforms:

## 2017-11-14 ENCOUNTER — Encounter (HOSPITAL_COMMUNITY): Payer: Self-pay

## 2017-11-14 ENCOUNTER — Emergency Department (HOSPITAL_COMMUNITY): Payer: 59

## 2017-11-14 ENCOUNTER — Emergency Department (HOSPITAL_COMMUNITY)
Admission: EM | Admit: 2017-11-14 | Discharge: 2017-11-15 | Disposition: A | Discharge status: Home / Self Care | Payer: 59 | Attending: Emergency Medicine | Admitting: Emergency Medicine

## 2017-11-14 ENCOUNTER — Other Ambulatory Visit: Payer: Self-pay

## 2017-11-14 DIAGNOSIS — Y939 Activity, unspecified: Secondary | ICD-10-CM | POA: Insufficient documentation

## 2017-11-14 DIAGNOSIS — Y929 Unspecified place or not applicable: Secondary | ICD-10-CM | POA: Diagnosis not present

## 2017-11-14 DIAGNOSIS — Z79899 Other long term (current) drug therapy: Secondary | ICD-10-CM | POA: Diagnosis not present

## 2017-11-14 DIAGNOSIS — Z23 Encounter for immunization: Secondary | ICD-10-CM | POA: Insufficient documentation

## 2017-11-14 DIAGNOSIS — S51811A Laceration without foreign body of right forearm, initial encounter: Secondary | ICD-10-CM

## 2017-11-14 DIAGNOSIS — W270XXA Contact with workbench tool, initial encounter: Secondary | ICD-10-CM | POA: Insufficient documentation

## 2017-11-14 DIAGNOSIS — S0081XA Abrasion of other part of head, initial encounter: Secondary | ICD-10-CM | POA: Diagnosis not present

## 2017-11-14 DIAGNOSIS — S61511A Laceration without foreign body of right wrist, initial encounter: Secondary | ICD-10-CM | POA: Insufficient documentation

## 2017-11-14 DIAGNOSIS — Y999 Unspecified external cause status: Secondary | ICD-10-CM | POA: Diagnosis not present

## 2017-11-14 DIAGNOSIS — S60911A Unspecified superficial injury of right wrist, initial encounter: Secondary | ICD-10-CM | POA: Diagnosis present

## 2017-11-14 DIAGNOSIS — S60411A Abrasion of left index finger, initial encounter: Secondary | ICD-10-CM | POA: Insufficient documentation

## 2017-11-14 MED ORDER — LIDOCAINE-EPINEPHRINE (PF) 2 %-1:200000 IJ SOLN
10.0000 mL | Freq: Once | INTRAMUSCULAR | Status: AC
  Administered 2017-11-15: 10 mL via INTRADERMAL
  Filled 2017-11-14: qty 20

## 2017-11-14 MED ORDER — LIDOCAINE HCL 2 % IJ SOLN
INTRAMUSCULAR | Status: AC
  Filled 2017-11-14: qty 20

## 2017-11-14 MED ORDER — BACITRACIN ZINC 500 UNIT/GM EX OINT
TOPICAL_OINTMENT | Freq: Once | CUTANEOUS | Status: AC
  Administered 2017-11-15: 1 via TOPICAL
  Filled 2017-11-14: qty 0.9

## 2017-11-14 MED ORDER — BACITRACIN ZINC 500 UNIT/GM EX OINT
TOPICAL_OINTMENT | CUTANEOUS | Status: AC
  Filled 2017-11-14: qty 0.9

## 2017-11-14 NOTE — ED Provider Notes (Signed)
[facility]-EMERGENCY DEPT Provider Note   CSN: [id number] Arrival date & time: 11/14/17  2228     History   Chief Complaint Chief Complaint  Patient presents with  . Laceration    HPI Carl Bolton is a 47 y.o. male.  HPI  47 yo M here with lac to R wrist, abrasions to face and L forearm.  The patient states that [place].  He sustained abrasions to his left face, left arm, and right arm.  His wife, who is a PA, cleaned the wounds.  However, he had persistent bleeding from his right forearm wound and subsequent presents for evaluation.  He also is unsure of his last tetanus status.  Denies any loss of consciousness.  No vision changes or eye pain.  Is a sharp, stabbing, left forearm pain.  He is not on any blood thinners.  Pain is worse with movement and palpation.  No alleviating factors.  Past Medical History:  Diagnosis Date  . Medical history non-contributory   . Rectal mass    drains occ    Patient Active Problem List   Diagnosis Date Noted  . Paresthesia 10/11/2014    Past Surgical History:  Procedure Laterality Date  . COLONOSCOPY    . LYMPH NODE BIOPSY     neck as teen  . MASS EXCISION N/A 05/24/2014   Procedure: EXCISION OF PERIANAL  MASS;  Surgeon: Almond Lint, MD;  Location: [facility];  Service: General;  Laterality: N/A;  . RECTAL EXAM UNDER ANESTHESIA N/A 05/24/2014   Procedure: RECTAL EXAM UNDER ANESTHESIA;  Surgeon: Almond Lint, MD;  Location: [facility];  Service: General;  Laterality: N/A;  . TONSILLECTOMY    . TYMPANOSTOMY TUBE PLACEMENT     both ears x2  . WISDOM TOOTH EXTRACTION          Home Medications    Prior to Admission medications   Medication Sig Start Date End Date Taking? Authorizing Provider  Albuterol Sulfate (PROAIR RESPICLICK) 108 (90 BASE) MCG/ACT AEPB INHALE 2 PUFFS EVERY 4-6 HOURS AS NEEDED.     [provider]  EPINEPHrine (EPIPEN 2-PAK) 0.3 mg/0.3 mL IJ SOAJ injection Inject into the muscle once.    [provider]  LORazepam (ATIVAN) 0.5 MG tablet Take 0.5 mg by mouth every 8 (eight) hours.    [provider]  magnesium oxide (MAG-OX) 400 MG tablet Take 400 mg by mouth daily.    [provider]  Multiple Vitamins-Minerals (MULTIVITAMIN WITH MINERALS) tablet Take 1 tablet by mouth daily.    [provider]  oxyCODONE-acetaminophen (ROXICET) 5-325 MG per tablet Take 1-2 tablets by mouth every 4 (four) hours as needed for severe pain. Patient not taking: Reported on 10/11/2014 05/24/14   Almond Lint, MD  QNASL 80 MCG/ACT AERS  09/05/14   [provider]  vitamin B-12 (CYANOCOBALAMIN) 100 MCG tablet Take 100 mcg by mouth daily.    [provider]    Family History Family History  Problem Relation Age of Onset  . Thyroid disease Father   . Hypothyroidism Sister   . Hypertension Mother   . Aneurysm Mother        Thoracic  . Anuerysm Maternal Grandfather        Deceased  . Healthy Son   . Healthy Daughter     Social History Social History  Tobacco Use  . Smoking status: Never Smoker  . Smokeless tobacco: Never Used  Substance Use Topics  . Alcohol use: Yes    Alcohol/week: 0.0 standard drinks    Comment: Occasionally, 2 beers weekly.   . Drug use: No     Allergies   Patient has no known allergies.   Review of Systems Review of Systems  Constitutional: Negative for chills, fatigue and fever.  HENT: Negative for congestion and rhinorrhea.   Eyes: Negative for visual disturbance.  Respiratory: Negative for cough, shortness of breath and wheezing.   Cardiovascular: Negative for chest pain and leg swelling.  Gastrointestinal: Negative for abdominal pain, diarrhea, nausea and vomiting.  Genitourinary: Negative for dysuria and flank pain.  Musculoskeletal: Negative for neck pain and neck stiffness.  Skin:  Positive for wound. Negative for rash.  Allergic/Immunologic: Negative for immunocompromised state.  Neurological: Negative for syncope, weakness and headaches.  All other systems reviewed and are negative.    Physical Exam Updated Vital Signs BP 126/73 (BP Location: Left Arm)   Pulse (!) 57   Temp 97.8 F (36.6 C) (Oral)   Resp 20   Ht 6\' 1"  (1.854 m)   Wt 88.5 kg   SpO2 98%   BMI 25.73 kg/m   Physical Exam  Constitutional: He is oriented to person, place, and time. He appears well-developed and well-nourished. No distress.  HENT:  Head: Normocephalic and atraumatic.  Scattered, superficial abrasions along the left forehead and nasal bridge.  No apparent eye trauma.  Extraocular movements intact.  Eyes: Conjunctivae are normal.  Neck: Neck supple.  Cardiovascular: Normal rate, regular rhythm and normal heart sounds.  Pulmonary/Chest: Effort normal. No respiratory distress. He has no wheezes.  Abdominal: He exhibits no distension.  Musculoskeletal: He exhibits no edema.  Neurological: He is alert and oriented to person, place, and time. He exhibits normal muscle tone.  Skin: Skin is warm. Capillary refill takes less than 2 seconds. No rash noted.  Approximately 2.5 cm curvilinear laceration to the right forearm.  Scattered superficial abrasions to the left middle and index fingers.  Nursing note and vitals reviewed.    ED Treatments / Results  Labs (all labs ordered are listed, but only abnormal results are displayed) Labs Reviewed - No data to display  EKG None  Radiology Dg Forearm Right  Result Date: 11/14/2017 CLINICAL DATA:  Right forearm laceration EXAM: RIGHT FOREARM - 2 VIEW COMPARISON:  None. FINDINGS: Bandaging overlies the volar aspect of the distal forearm. No radiopaque foreign body nor underlying acute osseous involvement of the radius nor ulna. Carpal rows are maintained. The elbow joint appears intact. IMPRESSION: Bandaging overlies the volar aspect  of the distal forearm. No acute osseous abnormality nor radiopaque foreign body. Electronically Signed   By: Tollie Eth M.D.   On: 11/14/2017 23:52    Procedures .Marland KitchenLaceration Repair Date/Time: 11/15/2017 12:44 AM Performed by: Shaune Pollack, MD Authorized by: Shaune Pollack, MD   Consent:    Consent obtained:  Verbal   Consent given by:  Patient   Risks discussed:  Infection, need for additional repair, pain, tendon damage, retained foreign body, vascular damage, poor cosmetic result, poor wound healing and nerve damage   Alternatives discussed:  Referral and delayed treatment Anesthesia (see MAR for exact dosages):    Anesthesia method:  Local infiltration   Local anesthetic:  Lidocaine 1% WITH epi Laceration details:    Location: Right wrist.   Length (cm):  2.5 Repair type:  Repair type:  Simple Pre-procedure details:    Preparation:  Patient was prepped and draped in usual sterile fashion and imaging obtained to evaluate for foreign bodies Exploration:    Hemostasis achieved with:  Direct pressure   Wound exploration: wound explored through full range of motion and entire depth of wound probed and visualized   Treatment:    Area cleansed with:  Betadine   Amount of cleaning:  Extensive   Irrigation solution:  Sterile water   Irrigation method:  Pressure wash Skin repair:    Repair method:  Sutures   Suture size:  4-0   Suture material:  Prolene   Number of sutures:  6 Approximation:    Approximation:  Close Post-procedure details:    Dressing:  Antibiotic ointment   Patient tolerance of procedure:  Tolerated well, no immediate complications   (including critical care time)  Medications Ordered in ED Medications  lidocaine-EPINEPHrine (XYLOCAINE W/EPI) 2 %-1:200000 (PF) injection 10 mL (10 mLs Intradermal Given 11/15/17 0001)  bacitracin ointment (1 application Topical Given 11/15/17 0013)  Tdap (BOOSTRIX) injection 0.5 mL (0.5 mLs Intramuscular Given  11/15/17 0029)     Initial Impression / Assessment and Plan / ED Course  I have reviewed the triage vital signs and the nursing notes.  Pertinent labs & imaging results that were available during my care of the patient were reviewed by me and considered in my medical decision making (see chart for details).   47 yo M here with R forearm lac, diffuse abrasions after falling with saw. Tetanus updated. No FB or bony injury on plain films. His facial abrasions are superficial and do not involve the eye or orbit. Lac repaired, will d/c with outpt wound care instructions.   Final Clinical Impressions(s) / ED Diagnoses   Final diagnoses:  Forearm laceration, right, initial encounter  Abrasion of face, initial encounter  Abrasion of left index finger, initial encounter    ED Discharge Orders    None       Shaune Pollack, MD 11/15/17 0231

## 2017-11-14 NOTE — ED Triage Notes (Signed)
Pt reports that he accidentally cut his R wrist and left index and middle fingers with a saw. Wife is a PA and wrapped and cleaned wounds when it happened, but R wrist has not properly closed. A&Ox4. Ambulatory. Pt also requests a tetanus as he does not know when his last one was.

## 2017-11-15 MED ORDER — TETANUS-DIPHTH-ACELL PERTUSSIS 5-2.5-18.5 LF-MCG/0.5 IM SUSP
0.5000 mL | Freq: Once | INTRAMUSCULAR | Status: AC
  Administered 2017-11-15: 0.5 mL via INTRAMUSCULAR
  Filled 2017-11-15: qty 0.5

## 2017-11-15 NOTE — Discharge Instructions (Addendum)
For your wound:  Apply antibiotic ointment 2-3 times daily Keep out of the sun No swimming, especially [place] the [place], until the wound is healed (at least 1 week) Remove sutures in 7-10 days Monitor for any signs of infection

## 2017-11-15 NOTE — ED Notes (Signed)
Cleaned wounds, dry dressing applied to right arm, bacitracin applied to nose and left tempo

## 2018-09-22 ENCOUNTER — Other Ambulatory Visit: Payer: Self-pay

## 2018-09-22 DIAGNOSIS — Z20822 Contact with and (suspected) exposure to covid-19: Secondary | ICD-10-CM

## 2018-09-23 LAB — NOVEL CORONAVIRUS, NAA: SARS-CoV-2, NAA: NOT DETECTED

## 2021-04-04 ENCOUNTER — Other Ambulatory Visit: Payer: Self-pay | Admitting: Family Medicine

## 2021-04-07 ENCOUNTER — Other Ambulatory Visit: Payer: Self-pay | Admitting: Family Medicine

## 2021-04-07 DIAGNOSIS — I83893 Varicose veins of bilateral lower extremities with other complications: Secondary | ICD-10-CM

## 2021-04-29 ENCOUNTER — Ambulatory Visit
Admission: RE | Admit: 2021-04-29 | Discharge: 2021-04-29 | Disposition: A | Discharge status: Home / Self Care | Payer: 59 | Source: Ambulatory Visit | Attending: Family Medicine | Admitting: Family Medicine

## 2021-04-29 DIAGNOSIS — I83893 Varicose veins of bilateral lower extremities with other complications: Secondary | ICD-10-CM

## 2021-05-08 ENCOUNTER — Other Ambulatory Visit: Payer: Self-pay | Admitting: Family Medicine

## 2021-05-08 DIAGNOSIS — L819 Disorder of pigmentation, unspecified: Secondary | ICD-10-CM

## 2021-05-27 ENCOUNTER — Ambulatory Visit
Admission: RE | Admit: 2021-05-27 | Discharge: 2021-05-27 | Disposition: A | Discharge status: Home / Self Care | Payer: 59 | Source: Ambulatory Visit | Attending: Family Medicine | Admitting: Family Medicine

## 2021-05-27 DIAGNOSIS — L819 Disorder of pigmentation, unspecified: Secondary | ICD-10-CM

## 2023-06-03 IMAGING — US US EXTREM LOW VENOUS
1 series · 13 of 24 positions shown · non-contrast
Comparison: None.

CLINICAL DATA: Edema, lower extremity varicosities, swelling



[Series 1: us extrem low venous · 0.07mm/px · 13 of 82 slices shown]
[im 1/82]
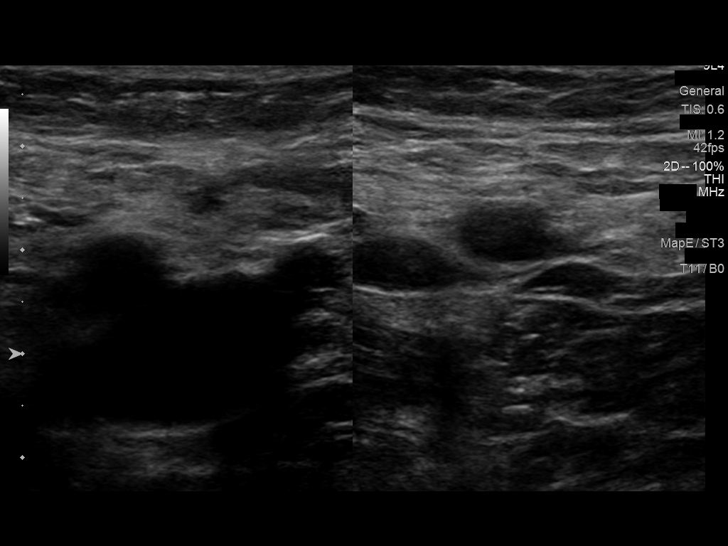
[im 8/82]
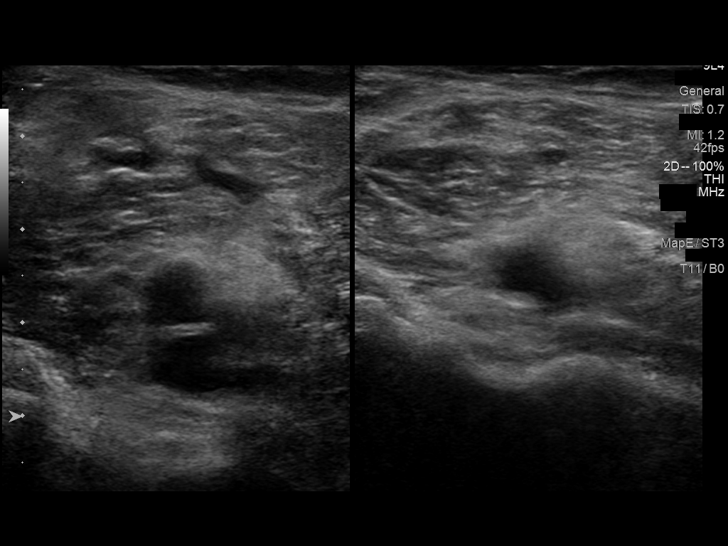
[im 15/82]
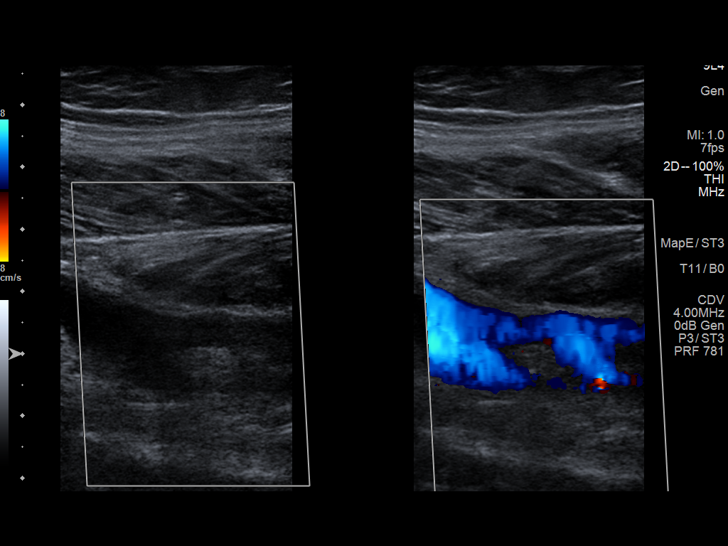
[im 22/82]
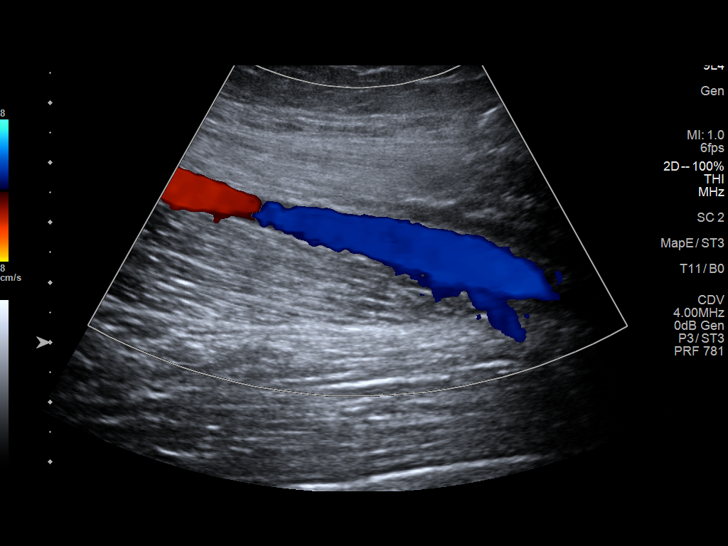
[im 29/82]
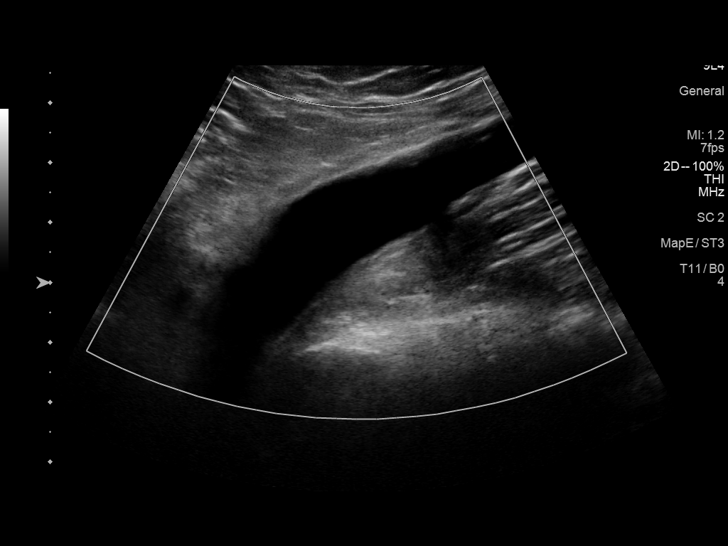
[im 36/82]
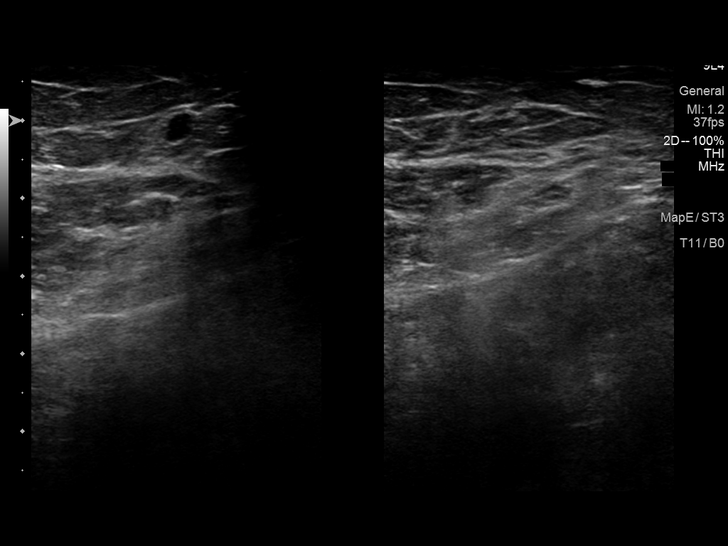
[im 43/82]
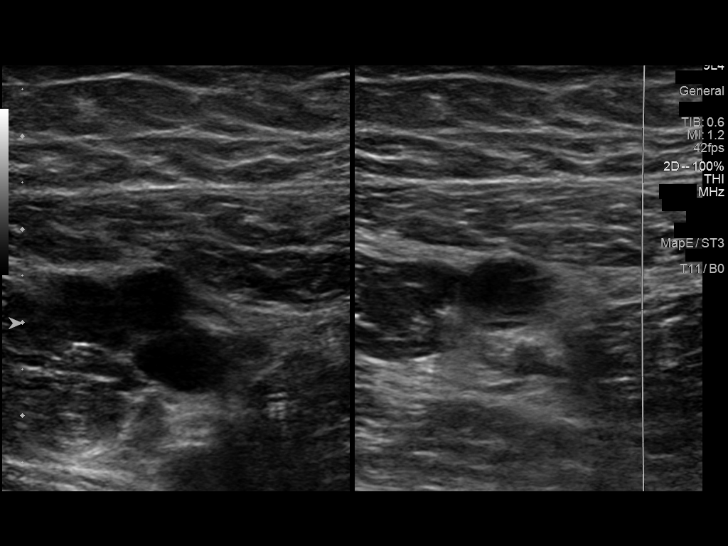
[im 46/82]
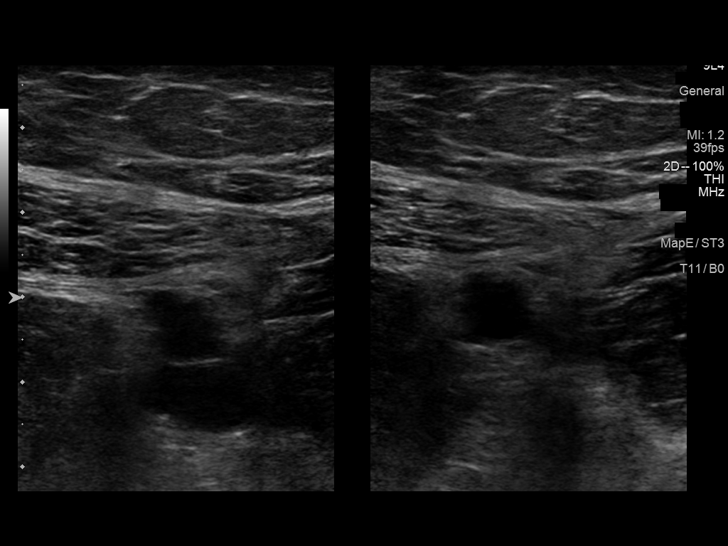
[im 53/82]
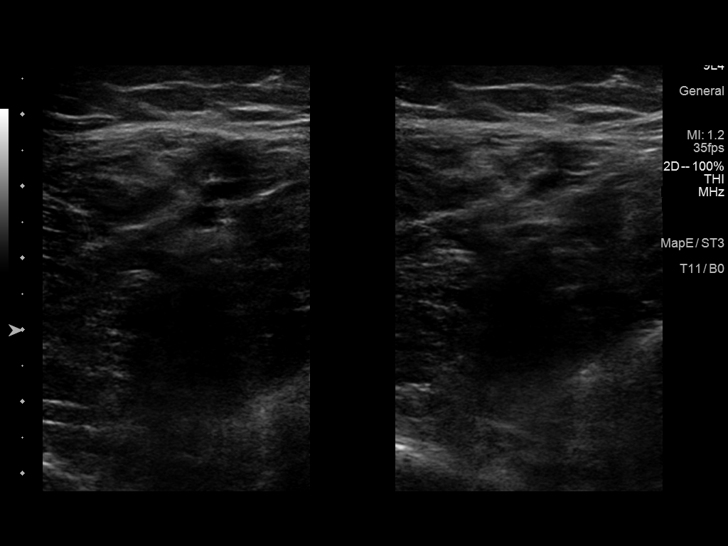
[im 60/82]
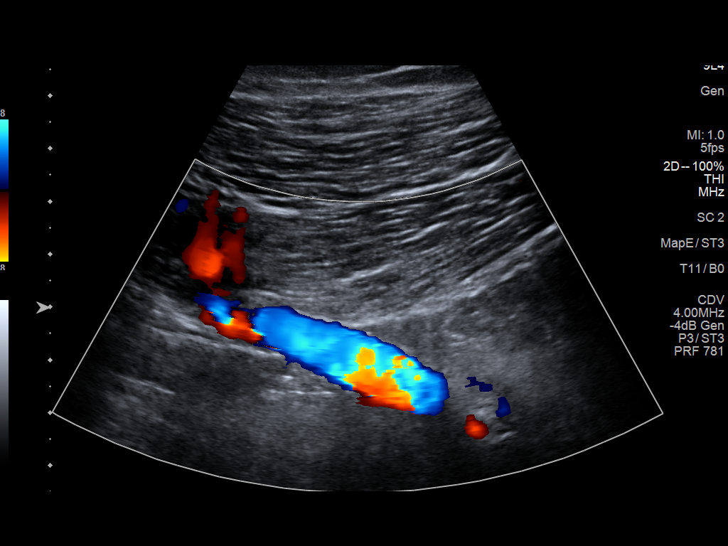
[im 67/82]
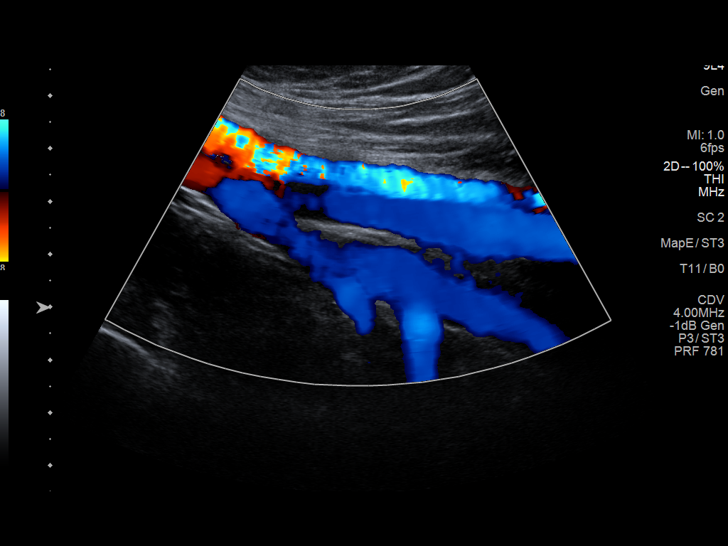
[im 74/82]
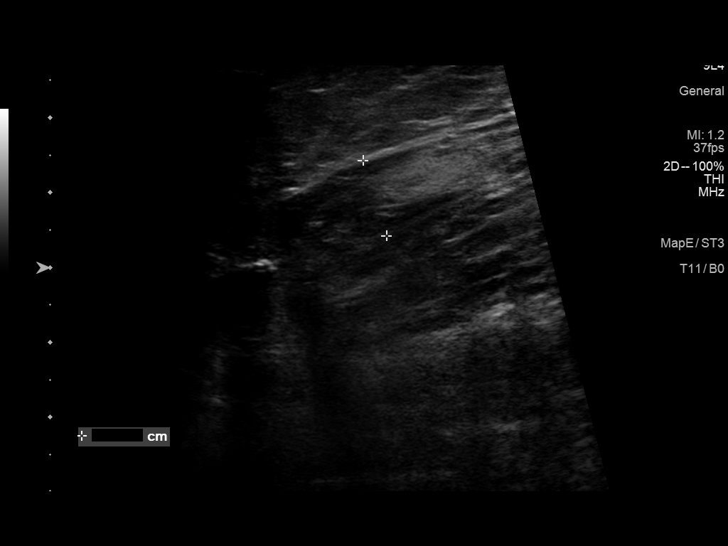
[im 82/82]
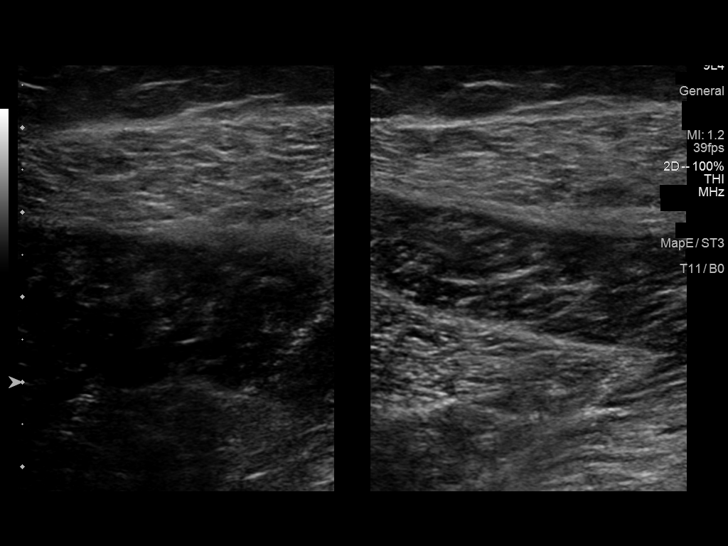

[13 of 24 positions shown; findings below may reference images not displayed]

FINDINGS: RIGHT LOWER EXTREMITY

Common Femoral Vein: No evidence of thrombus. Normal
compressibility, respiratory phasicity and response to augmentation.

Saphenofemoral Junction: No evidence of thrombus. Normal
compressibility and flow on color Doppler imaging.

Profunda Femoral Vein: No evidence of thrombus. Normal
compressibility and flow on color Doppler imaging.

Femoral Vein: No evidence of thrombus. Normal compressibility,
respiratory phasicity and response to augmentation.

Popliteal Vein: No evidence of thrombus. Normal compressibility,
respiratory phasicity and response to augmentation.

Calf Veins: No evidence of thrombus. Normal compressibility and flow
on color Doppler imaging.

Superficial Great Saphenous Vein: No evidence of thrombus. Normal
compressibility.

LEFT LOWER EXTREMITY

Common Femoral Vein: No evidence of thrombus. Normal
compressibility, respiratory phasicity and response to augmentation.

Saphenofemoral Junction: No evidence of thrombus. Normal
compressibility and flow on color Doppler imaging.

Profunda Femoral Vein: No evidence of thrombus. Normal
compressibility and flow on color Doppler imaging.

Femoral Vein: No evidence of thrombus. Normal compressibility,
respiratory phasicity and response to augmentation.

Popliteal Vein: No evidence of thrombus. Normal compressibility,
respiratory phasicity and response to augmentation.

Calf Veins: No evidence of thrombus. Normal compressibility and flow
on color Doppler imaging.

Superficial Great Saphenous Vein: No evidence of thrombus. Normal
compressibility.
IMPRESSION: No evidence of deep venous thrombosis in either lower extremity.

## 2023-09-10 ENCOUNTER — Other Ambulatory Visit (HOSPITAL_COMMUNITY): Payer: Self-pay | Admitting: Family Medicine

## 2023-09-10 DIAGNOSIS — Z136 Encounter for screening for cardiovascular disorders: Secondary | ICD-10-CM

## 2023-10-06 ENCOUNTER — Ambulatory Visit (HOSPITAL_COMMUNITY)
Admission: RE | Admit: 2023-10-06 | Discharge: 2023-10-06 | Disposition: A | Discharge status: Home / Self Care | Payer: Self-pay | Source: Ambulatory Visit | Attending: Family Medicine | Admitting: Family Medicine

## 2023-10-06 DIAGNOSIS — Z136 Encounter for screening for cardiovascular disorders: Secondary | ICD-10-CM | POA: Insufficient documentation
